# Patient Record
Sex: Female | Born: 1935 | Race: White | Hispanic: No | Smoking: Never smoker
Health system: Southern US, Community
[De-identification: ages and names within clinical notes are randomized; demographics above are authoritative.]

## PROBLEM LIST (undated history)

## (undated) DIAGNOSIS — I10 Essential (primary) hypertension: Secondary | ICD-10-CM

## (undated) DIAGNOSIS — M1990 Unspecified osteoarthritis, unspecified site: Secondary | ICD-10-CM

## (undated) DIAGNOSIS — E119 Type 2 diabetes mellitus without complications: Secondary | ICD-10-CM

---

## 2005-05-11 ENCOUNTER — Emergency Department (HOSPITAL_COMMUNITY): Admission: EM | Admit: 2005-05-11 | Discharge: 2005-05-12 | Payer: Self-pay | Admitting: Emergency Medicine

## 2008-07-22 ENCOUNTER — Ambulatory Visit (HOSPITAL_COMMUNITY): Admission: RE | Admit: 2008-07-22 | Discharge: 2008-07-22 | Payer: Self-pay | Admitting: Ophthalmology

## 2010-09-27 LAB — PROTIME-INR
INR: 1 (ref 0.00–1.49)
Prothrombin Time: 13 seconds (ref 11.6–15.2)

## 2010-09-27 LAB — URINALYSIS, ROUTINE W REFLEX MICROSCOPIC
Bilirubin Urine: NEGATIVE
Glucose, UA: NEGATIVE mg/dL
Hgb urine dipstick: NEGATIVE
Ketones, ur: NEGATIVE mg/dL
Leukocytes, UA: NEGATIVE
Nitrite: NEGATIVE
Protein, ur: 30 mg/dL — AB
Specific Gravity, Urine: 1.021 (ref 1.005–1.030)
Urobilinogen, UA: 0.2 mg/dL (ref 0.0–1.0)
pH: 5.5 (ref 5.0–8.0)

## 2010-09-27 LAB — URINE MICROSCOPIC-ADD ON

## 2010-09-27 LAB — COMPREHENSIVE METABOLIC PANEL
ALT: 25 U/L (ref 0–35)
AST: 28 U/L (ref 0–37)
Albumin: 3.9 g/dL (ref 3.5–5.2)
Alkaline Phosphatase: 75 U/L (ref 39–117)
BUN: 29 mg/dL — ABNORMAL HIGH (ref 6–23)
CO2: 23 mEq/L (ref 19–32)
Calcium: 9.8 mg/dL (ref 8.4–10.5)
Chloride: 109 mEq/L (ref 96–112)
Creatinine, Ser: 1.28 mg/dL — ABNORMAL HIGH (ref 0.4–1.2)
GFR calc Af Amer: 50 mL/min — ABNORMAL LOW (ref >60)
GFR calc non Af Amer: 41 mL/min — ABNORMAL LOW (ref >60)
Glucose, Bld: 70 mg/dL (ref 70–99)
Potassium: 4.1 mEq/L (ref 3.5–5.1)
Sodium: 141 mEq/L (ref 135–145)
Total Bilirubin: 0.9 mg/dL (ref 0.3–1.2)
Total Protein: 6.9 g/dL (ref 6.0–8.3)

## 2010-09-27 LAB — GLUCOSE, CAPILLARY
Glucose-Capillary: 138 mg/dL — ABNORMAL HIGH (ref 70–99)
Glucose-Capillary: 80 mg/dL (ref 70–99)
Glucose-Capillary: 88 mg/dL (ref 70–99)

## 2010-09-27 LAB — CBC
HCT: 36.7 % (ref 36.0–46.0)
Hemoglobin: 12.5 g/dL (ref 12.0–15.0)
MCHC: 34 g/dL (ref 30.0–36.0)
MCV: 88.7 fL (ref 78.0–100.0)
Platelets: 313 10*3/uL (ref 150–400)
RBC: 4.14 MIL/uL (ref 3.87–5.11)
RDW: 15.1 % (ref 11.5–15.5)
WBC: 7.8 10*3/uL (ref 4.0–10.5)

## 2010-09-27 LAB — APTT: aPTT: 26 seconds (ref 24–37)

## 2010-10-25 NOTE — Op Note (Signed)
NAME:  Helen Gutierrez, Helen Gutierrez                 ACCOUNT NO.:  [id number]   MEDICAL RECORD NO.:  [id number]          PATIENT TYPE:  AMB   LOCATION:  SDS                          FACILITY:  [facility]   PHYSICIAN:  Lanna Poche, M.D. DATE OF BIRTH:  10/06/1933   DATE OF PROCEDURE:  07/22/2008  DATE OF DISCHARGE:                               OPERATIVE REPORT   PREOPERATIVE DIAGNOSIS:  Proliferative diabetic retinopathy with  nonclearing vitreous hemorrhage, left eye.   POSTOPERATIVE DIAGNOSIS:  Proliferative diabetic retinopathy with  nonclearing vitreous hemorrhage, left eye.   PROCEDURES:  Pars plana vitrectomy, peeling of posterior hyaloid face,  peeling of internal limiting membrane, peripheral panretinal  photocoagulation, subtenon Kenalog injection, left eye.   SURGEON:  Lanna Poche, MD   ANESTHESIA:  General endotracheal.   ESTIMATED BLOOD LOSS:  Less than 1 mL.   COMPLICATIONS:  None.   OPERATIVE NOTE:  The patient was taken to the operating room.  After  induction of general anesthesia, the left eye was prepped and draped in  the usual fashion.  The lid speculum was introduced in the conjunctival  peritomy was developed temporally and supranasally.  Hemostasis was  obtained with laser cautery, and sclerotomies were fashioned 3 mm  posterior to the limbus at 1:30, 10:30, and 4:30.  The superior  sclerotomies were plugged and a 4-mm infusion cannula was secured to the  4:30 sclerotomy with a temporary sutures of 7-0 Vicryl.  The tip was  visually inspected and found to be in good position.  Landers ring was  secured with globe 7-0 Vicryl sutures at 3 and 9.  The plugs were  removed and 30-degree prismatic lens was applied on the surface of the  eye.  A central core followed by peripheral vitrectomy was performed.  There was no posterior hyaloid separation.  The posterior hyaloid was  engaged with a vitrector and stripped off the surface of the retina.  The peripheral vitrectomy  was then completed at 360 degrees.  Scleral  depression was used to further trim the hemorrhagic to __________ blood  along the vitreous space with the inferior half of the retina.  Magnifying flat lens was inserted __________ the surface of the eye, a  small amount of hemorrhage that settled posteriorly was removed with a  silicone cap.  Inspection of the macula showed there to be __________  around the previous cystic changes of the glistening surface of the  retina, was elected to peel internal limiting membrane.  The MVR blade  was used to make an incision into the internal limiting membrane.  The  internal limited membrane was then elevated with a Rice pick and peeled  off the foveal tissue in 3 separate pieces.  The instrument removed from  the eyes and holes plugged.  The Monett lens were removed.  Inspection  with an ophthalmoscope and scleral depression revealed there to be no  retinal breaks or tears, and the panretinal photocoagulation filled in  the few gaps with an indirect ophthalmoscope laser.  The superior  sclerotomy was then closed with 7-0 Vicryl.  The  infusion cannula was  removed, preplaced and secured.  The subtenon space was injected with 4  mg of Kenalog inferotemporally.  The conjunctivae was drawn and  reapproximated with interrupted running suture of 6-0 plain gut.  The  pressure was checked with the Barraquer tonometer and found to lay at  21.  The subconjunctival space was irrigated with 0.75% Marcaine  followed by  subconjunctival injection of 100 mg of ceftazidime and 10 mg of  Decadron.  The lid speculum was then removed, mixed antibiotic ointment  was applied to the surface of the eye.  Eye patch and shield was then  placed over the patient's left eye at the completion of the surgery.           ______________________________  Lanna Poche, M.D.     JTH/MEDQ  D:  07/22/2008  T:  07/23/2008  Job:  [id number]

## 2010-10-25 NOTE — Op Note (Signed)
NAME:  Helen Gutierrez, Helen Gutierrez                 ACCOUNT NO.:  [id number]   MEDICAL RECORD NO.:  [id number]          PATIENT TYPE:  AMB   LOCATION:  SDS                          FACILITY:  [facility]   PHYSICIAN:  Lanna Poche, M.D. DATE OF BIRTH:  1933-06-24   DATE OF PROCEDURE:  07/22/2008  DATE OF DISCHARGE:                               OPERATIVE REPORT   PREOPERATIVE DIAGNOSIS:  Proliferative diabetic retinopathy with  nonclearing vitreous hemorrhage, left eye.   POSTOPERATIVE DIAGNOSIS:  Proliferative diabetic retinopathy with  nonclearing vitreous hemorrhage, left eye.   PROCEDURE:  Partial vitrectomy, peeling of internal limiting membrane,  peeling of posterior hyaloid face, peripheral panretinal  photocoagulation, subtenon Kenalog injection, left eye.   SURGEON:  Lanna Poche, MD   ASSISTANT:   Dictation ended at this point.           ______________________________  Lanna Poche, M.D.     JTH/MEDQ  D:  07/22/2008  T:  07/23/2008  Job:  [id number]

## 2018-03-05 ENCOUNTER — Emergency Department (INDEPENDENT_AMBULATORY_CARE_PROVIDER_SITE_OTHER)
Admission: EM | Admit: 2018-03-05 | Discharge: 2018-03-05 | Disposition: A | Discharge status: Home / Self Care | Payer: Medicare Other | Source: Home / Self Care | Attending: Family Medicine | Admitting: Family Medicine

## 2018-03-05 ENCOUNTER — Other Ambulatory Visit: Payer: Self-pay

## 2018-03-05 ENCOUNTER — Emergency Department (INDEPENDENT_AMBULATORY_CARE_PROVIDER_SITE_OTHER): Payer: Medicare Other

## 2018-03-05 DIAGNOSIS — S40021A Contusion of right upper arm, initial encounter: Secondary | ICD-10-CM

## 2018-03-05 DIAGNOSIS — I739 Peripheral vascular disease, unspecified: Secondary | ICD-10-CM

## 2018-03-05 DIAGNOSIS — S51811A Laceration without foreign body of right forearm, initial encounter: Secondary | ICD-10-CM

## 2018-03-05 DIAGNOSIS — Z23 Encounter for immunization: Secondary | ICD-10-CM

## 2018-03-05 HISTORY — DX: Unspecified osteoarthritis, unspecified site: M19.90

## 2018-03-05 HISTORY — DX: Type 2 diabetes mellitus without complications: E11.9

## 2018-03-05 HISTORY — DX: Essential (primary) hypertension: I10

## 2018-03-05 MED ORDER — TETANUS-DIPHTH-ACELL PERTUSSIS 5-2.5-18.5 LF-MCG/0.5 IM SUSP
0.5000 mL | Freq: Once | INTRAMUSCULAR | Status: AC
  Administered 2018-03-05: 0.5 mL via INTRAMUSCULAR

## 2018-03-05 NOTE — ED Triage Notes (Signed)
Pt stated that she fell against the wall/ her suitcase yesterday morning.  Elbow area swollen painful to touch, and skin tear just below elbow.

## 2018-03-05 NOTE — ED Provider Notes (Signed)
[facility]    CSN: [id number] Arrival date & time: 03/05/18  1246     History   Chief Complaint Chief Complaint  Patient presents with  . Arm Pain    HPI Helen Gutierrez is a 84 y.o. female.   Patient lost her balance in her bedroom yesterday and scraped her right lateral arm against a suitcase, resulting in a skin tear/abrasion.  She does not remember her last Tdap.   Arm Injury  Location:  Elbow Elbow location:  R elbow Injury: yes   Time since incident:  1 day Mechanism of injury comment:  Abrasion Pain details:    Quality:  Aching   Radiates to:  Does not radiate   Severity:  Mild   Onset quality:  Sudden   Duration:  1 day   Timing:  Constant   Progression:  Improving Dislocation: no   Tetanus status:  Out of date Prior injury to area:  No Relieved by:  None tried Ineffective treatments: bandage. Associated symptoms: swelling   Associated symptoms: no decreased range of motion, no numbness, no stiffness and no tingling     Past Medical History:  Diagnosis Date  . Arthritis   . Diabetes mellitus without complication ([place])   . Hypertension     There are no active problems to display for this patient.   History reviewed. No pertinent surgical history.  OB History   None      Home Medications    Prior to Admission medications   Medication Sig Start Date End Date Taking? Authorizing Provider  amLODipine (NORVASC) 5 MG tablet Take by mouth. 01/01/13  Yes [provider]  clopidogrel (PLAVIX) 75 MG tablet Take by mouth. 01/01/13  Yes [provider]  fluticasone (FLONASE) 50 MCG/ACT nasal spray Place into the nose. 02/09/17  Yes [provider]  furosemide (LASIX) 20 MG tablet Take by mouth. 12/08/16  Yes [provider]  glucose blood (ACCU-CHEK GUIDE) test strip USE 1 STRIP THREE TIMES DAILY 09/10/17  Yes [provider]  hydroxychloroquine (PLAQUENIL) 200 MG tablet Take by mouth. 12/12/17  Yes  [provider]  Insulin Lispro Prot & Lispro (HUMALOG MIX 75/25 KWIKPEN) (75-25) 100 UNIT/ML Kwikpen Inject into the skin. 02/01/13  Yes [provider]  isosorbide mononitrate (IMDUR) 60 MG 24 hr tablet TAKE ONE TABLET BY MOUTH EVERY DAY 01/14/13  Yes [provider]  lidocaine (LIDODERM) 5 % Apply patch to painful area. Patch may remain in place for up to 12 hours in a 24 hour period. 02/13/18 03/15/18 Yes [provider]  lisinopril (PRINIVIL,ZESTRIL) 20 MG tablet Take by mouth. 03/30/14  Yes [provider]  nitroGLYCERIN (NITROSTAT) 0.4 MG SL tablet Place under the tongue. 09/26/12  Yes [provider]  pantoprazole (PROTONIX) 40 MG tablet Take by mouth. 03/30/14  Yes [provider]  traMADol (ULTRAM) 50 MG tablet Take by mouth. 02/12/18  Yes [provider]  acetaminophen (TYLENOL) 500 MG tablet Take by mouth.    [provider]  Blood Glucose Monitoring Suppl (ACCU-CHEK GUIDE) w/Device KIT USE AS DIRECTED BY PHYSICIAN TO MONITOR BLOOD SUGARS. 02/26/18   [provider]  cetirizine (ZYRTEC) 10 MG tablet Take by mouth.    [provider]  ondansetron (ZOFRAN-ODT) 4 MG disintegrating tablet  02/14/18   [provider]    Family History History reviewed. No pertinent family history.  Social History Social History   Tobacco Use  . Smoking status: Unknown If  Ever Smoked  . Smokeless tobacco: Never Used  Substance Use Topics  . Alcohol use: Not Currently  . Drug use: Not Currently     Allergies   Sulfa antibiotics and Sulfasalazine   Review of Systems Review of Systems  Musculoskeletal: Negative for stiffness.  All other systems reviewed and are negative.    Physical Exam Triage Vital Signs ED Triage Vitals  Enc Vitals Group     BP 03/05/18 1317 138/71     Pulse Rate 03/05/18 1317 98     Resp --      Temp 03/05/18 1317 (!) 97.4 F (36.3 C)     Temp Source 03/05/18 1317  Oral     SpO2 03/05/18 1317 98 %     Weight 03/05/18 1318 134 lb (60.8 kg)     Height --      Head Circumference --      Peak Flow --      Pain Score --      Pain Loc --      Pain Edu? --      Excl. in [place]? --    No data found.  Updated Vital Signs BP 138/71 (BP Location: Right Arm)   Pulse 98   Temp (!) 97.4 F (36.3 C) (Oral)   Wt 60.8 kg   SpO2 98%   Visual Acuity Right Eye Distance:   Left Eye Distance:   Bilateral Distance:    Right Eye Near:   Left Eye Near:    Bilateral Near:     Physical Exam  Constitutional: She appears well-developed and well-nourished. No distress.  HENT:  Head: Atraumatic.  Eyes: Pupils are equal, round, and reactive to light. Conjunctivae are normal.  Neck: Normal range of motion.  Cardiovascular: Normal rate.  Pulmonary/Chest: Effort normal.  Musculoskeletal:       Right elbow: She exhibits swelling. Tenderness found. Radial head tenderness noted.       Arms: Right elbow has full range of motion, with tenderness to palpation over the radial head.  There is a 4cm diameter skin tear present as noted on diagram.  Avulsed skin at edge of wound does not appear viable.  No surrounding erythema or warmth.  Right forearm has mild generalized edema with mild tenderness to palpation.  Distal neurovascular function is intact.   Neurological: She is alert.  Skin: Skin is warm and dry.  Nursing note and vitals reviewed.    UC Treatments / Results  Labs (all labs ordered are listed, but only abnormal results are displayed) Labs Reviewed - No data to display  EKG None  Radiology Dg Elbow Complete Right  Result Date: 03/05/2018 CLINICAL DATA:  Patient slipped and fell. EXAM: RIGHT ELBOW - COMPLETE 3+ VIEW COMPARISON:  No recent prior. FINDINGS: No acute bony or joint abnormality identified. No evidence of fracture or dislocation. Degenerative changes right elbow. Peripheral vascular calcification. IMPRESSION: 1.  No acute abnormality.   Degenerative changes right elbow. 2.  Peripheral vascular disease. Electronically Signed   By: Marcello Moores  Register   On: 03/05/2018 14:00    Procedures Procedures  Wound debridement With sterile technique, lavaged skin tear/abrasion right arm with sterile saline.  Debrided non-viable skin from edge of wound.  Applied Bacitracin followed by 10cm by 10cm Mepelex Border dressing.  Secured Mepelex with light wrap of CoBan  Medications Ordered in UC Medications  Tdap (BOOSTRIX) injection 0.5 mL (0.5 mLs Intramuscular Given 03/05/18 1355)    Initial Impression / Assessment and Plan /  UC Course  I have reviewed the triage vital signs and the nursing notes.  Pertinent labs & imaging results that were available during my care of the patient were reviewed by me and considered in my medical decision making (see chart for details).    Administered Tdap  Return in 3 days for followup visit and dressing change (will redress with Mepelex Border).    Return for any signs of infection:  Increasing redness, swelling, pain, heat, drainage, etc.      Final Clinical Impressions(s) / UC Diagnoses   Final diagnoses:  Contusion of right upper extremity, initial encounter  Skin tear of right forearm without complication, initial encounter     Discharge Instructions     Elevate arm whenever possible.  Keep bandage clean and dry, and leave in place until follow-up visit.  May take Tylenol as needed for pain.    ED Prescriptions    None         Kandra Nicolas, MD 03/05/18 [id number]

## 2018-03-05 NOTE — Discharge Instructions (Signed)
Elevate arm whenever possible.  Keep bandage clean and dry, and leave in place until follow-up visit.  May take Tylenol as needed for pain.

## 2018-03-08 ENCOUNTER — Emergency Department (INDEPENDENT_AMBULATORY_CARE_PROVIDER_SITE_OTHER)
Admission: EM | Admit: 2018-03-08 | Discharge: 2018-03-08 | Disposition: A | Discharge status: Home / Self Care | Payer: Medicare Other | Source: Home / Self Care | Attending: Family Medicine | Admitting: Family Medicine

## 2018-03-08 ENCOUNTER — Encounter: Payer: Self-pay | Admitting: *Deleted

## 2018-03-08 DIAGNOSIS — Z5189 Encounter for other specified aftercare: Secondary | ICD-10-CM

## 2018-03-08 NOTE — ED Triage Notes (Signed)
Patient is here for wound check to RFA

## 2018-03-08 NOTE — Discharge Instructions (Addendum)
Keep wound bandaged, clean, and dry. Return earlier if increased pain, swelling, redness occur.

## 2018-03-08 NOTE — ED Provider Notes (Signed)
[name]    CSN: [id number] Arrival date & time: 03/08/18  3005     History   Chief Complaint Chief Complaint  Patient presents with  . Wound Check    HPI Helen Gutierrez is a 84 y.o. female.   Patient returns for wound check and dressing change skin tear right forearm.  She reports mild burning at the site but no swelling or pain.  The history is provided by the patient and a relative.    Past Medical History:  Diagnosis Date  . Arthritis   . Diabetes mellitus without complication ([place])   . Hypertension     There are no active problems to display for this patient.   History reviewed. No pertinent surgical history.  OB History   None      Home Medications    Prior to Admission medications   Medication Sig Start Date End Date Taking? Authorizing Provider  acetaminophen (TYLENOL) 500 MG tablet Take by mouth.    [provider]  amLODipine (NORVASC) 5 MG tablet Take by mouth. 01/01/13   [provider]  Blood Glucose Monitoring Suppl (ACCU-CHEK GUIDE) w/Device KIT USE AS DIRECTED BY PHYSICIAN TO MONITOR BLOOD SUGARS. 02/26/18   [provider]  cetirizine (ZYRTEC) 10 MG tablet Take by mouth.    [provider]  clopidogrel (PLAVIX) 75 MG tablet Take by mouth. 01/01/13   [provider]  fluticasone (FLONASE) 50 MCG/ACT nasal spray Place into the nose. 02/09/17   [provider]  furosemide (LASIX) 20 MG tablet Take by mouth. 12/08/16   [provider]  glucose blood (ACCU-CHEK GUIDE) test strip USE 1 [place] DAILY 09/10/17   [provider]  hydroxychloroquine (PLAQUENIL) 200 MG tablet Take by mouth. 12/12/17   [provider]  Insulin Lispro Prot & Lispro (HUMALOG MIX 75/25 KWIKPEN) (75-25) 100 UNIT/ML Kwikpen Inject into the skin. 02/01/13   [provider]  isosorbide mononitrate (IMDUR) 60 MG 24 hr tablet TAKE ONE TABLET BY MOUTH EVERY DAY 01/14/13   [provider]  lidocaine (LIDODERM) 5 % Apply patch to painful area. Patch may remain in place for up to 12 hours in a 24 hour period. 02/13/18 03/15/18  [provider]  lisinopril (PRINIVIL,ZESTRIL) 20 MG tablet Take by mouth. 03/30/14   [provider]  nitroGLYCERIN (NITROSTAT) 0.4 MG SL tablet Place under the tongue. 09/26/12   [provider]  ondansetron (ZOFRAN-ODT) 4 MG disintegrating tablet  02/14/18   [provider]  pantoprazole (PROTONIX) 40 MG tablet Take by mouth. 03/30/14   [provider]  traMADol (ULTRAM) 50 MG tablet Take by mouth. 02/12/18   [provider]    Family History History reviewed. No pertinent family history.  Social History Social History   Tobacco Use  . Smoking status: Unknown If Ever Smoked  . Smokeless tobacco: Never Used  Substance Use Topics  . Alcohol use: Not Currently  . Drug use: Not Currently     Allergies   Sulfa antibiotics and Sulfasalazine   Review of Systems Review of Systems  Constitutional: Negative for chills, diaphoresis, fatigue and fever.  Musculoskeletal: Negative for joint swelling.  Skin: Negative for color change.  All other systems reviewed and are negative.    Physical Exam Triage Vital Signs ED Triage Vitals [03/08/18 1046]  Enc Vitals Group     BP (!) 153/73     Pulse Rate 86     Resp  Temp (!) 97.5 F (36.4 C)     Temp Source Oral     SpO2 99 %     Weight      Height      Head Circumference      Peak Flow      Pain Score 2     Pain Loc      Pain Edu?      Excl. in [place]?    No data found.  Updated Vital Signs BP (!) 153/73 (BP Location: Left Arm)   Pulse 86   Temp (!) 97.5 F (36.4 C) (Oral)   SpO2 99%   Visual Acuity Right Eye Distance:   Left Eye Distance:   Bilateral Distance:    Right Eye Near:   Left Eye Near:    Bilateral Near:     Physical Exam Patient appears comfortable and in no distress.  Removed bandage from abrasion  right arm.  Good granulation tissue developing. Small amount bleeding centrally; no evidence cellulitis.  Reapplied Bacitracin and Mepelex Border 10cm dressing followed by light wrap of CoBan.  UC Treatments / Results  Labs (all labs ordered are listed, but only abnormal results are displayed) Labs Reviewed - No data to display  EKG None  Radiology No results found.  Procedures Procedures (including critical care time)  Medications Ordered in UC Medications - No data to display  Initial Impression / Assessment and Plan / UC Course  I have reviewed the triage vital signs and the nursing notes.  Pertinent labs & imaging results that were available during my care of the patient were reviewed by me and considered in my medical decision making (see chart for details).    There is no evidence of bacterial infection today.  Return in five days for dressing change with Mepelex Border 10cm dressing  Final Clinical Impressions(s) / UC Diagnoses   Final diagnoses:  Visit for wound check     Discharge Instructions     Keep wound bandaged, clean, and dry. Return earlier if increased pain, swelling, redness occur.    ED Prescriptions    None         Kandra Nicolas, MD 03/08/18 1129

## 2018-03-14 ENCOUNTER — Emergency Department (INDEPENDENT_AMBULATORY_CARE_PROVIDER_SITE_OTHER)
Admission: EM | Admit: 2018-03-14 | Discharge: 2018-03-14 | Disposition: A | Discharge status: Home / Self Care | Payer: Medicare Other | Source: Home / Self Care | Attending: Family Medicine | Admitting: Family Medicine

## 2018-03-14 ENCOUNTER — Other Ambulatory Visit: Payer: Self-pay

## 2018-03-14 DIAGNOSIS — Z5189 Encounter for other specified aftercare: Secondary | ICD-10-CM

## 2018-03-14 MED ORDER — MUPIROCIN 2 % EX OINT: 1.0000 "application " | TOPICAL_OINTMENT | Freq: Two times a day (BID) | CUTANEOUS | 2 refills | Status: AC

## 2018-03-14 NOTE — Discharge Instructions (Signed)
Change dressing and apply mupirocin ointment twice daily or every other day.  Keep bandage clean and dry.  Return for any signs of infection (or follow-up with family doctor):  Increasing redness, swelling, pain, heat, drainage, etc.

## 2018-03-14 NOTE — ED Provider Notes (Signed)
[name]    CSN: [id number] Arrival date & time: 03/14/18  1435     History   Chief Complaint Chief Complaint  Patient presents with  . Wound Check    RT forearm    HPI Helen Gutierrez is a 84 y.o. female.   Patient returns for dressing change on abrasion/skin tear right forearm.  She denies significant pain/swelling.  The history is provided by the patient.    Past Medical History:  Diagnosis Date  . Arthritis   . Diabetes mellitus without complication ([place])   . Hypertension     There are no active problems to display for this patient.   History reviewed. No pertinent surgical history.  OB History   None      Home Medications    Prior to Admission medications   Medication Sig Start Date End Date Taking? Authorizing Provider  acetaminophen (TYLENOL) 500 MG tablet Take by mouth.    [provider]  amLODipine (NORVASC) 5 MG tablet Take by mouth. 01/01/13   [provider]  Blood Glucose Monitoring Suppl (ACCU-CHEK GUIDE) w/Device KIT USE AS DIRECTED BY PHYSICIAN TO MONITOR BLOOD SUGARS. 02/26/18   [provider]  cetirizine (ZYRTEC) 10 MG tablet Take by mouth.    [provider]  clopidogrel (PLAVIX) 75 MG tablet Take by mouth. 01/01/13   [provider]  fluticasone (FLONASE) 50 MCG/ACT nasal spray Place into the nose. 02/09/17   [provider]  furosemide (LASIX) 20 MG tablet Take by mouth. 12/08/16   [provider]  glucose blood (ACCU-CHEK GUIDE) test strip USE 1 [place] DAILY 09/10/17   [provider]  hydroxychloroquine (PLAQUENIL) 200 MG tablet Take by mouth. 12/12/17   [provider]  Insulin Lispro Prot & Lispro (HUMALOG MIX 75/25 KWIKPEN) (75-25) 100 UNIT/ML Kwikpen Inject into the skin. 02/01/13   [provider]  isosorbide mononitrate (IMDUR) 60 MG 24 hr tablet TAKE ONE TABLET BY MOUTH EVERY DAY 01/14/13   [provider]  lidocaine  (LIDODERM) 5 % Apply patch to painful area. Patch may remain in place for up to 12 hours in a 24 hour period. 02/13/18 03/15/18  [provider]  lisinopril (PRINIVIL,ZESTRIL) 20 MG tablet Take by mouth. 03/30/14   [provider]  mupirocin ointment (BACTROBAN) 2 % Apply 1 application topically 2 (two) times daily. 03/14/18   Kandra Nicolas, MD  nitroGLYCERIN (NITROSTAT) 0.4 MG SL tablet Place under the tongue. 09/26/12   [provider]  ondansetron (ZOFRAN-ODT) 4 MG disintegrating tablet  02/14/18   [provider]  pantoprazole (PROTONIX) 40 MG tablet Take by mouth. 03/30/14   [provider]  traMADol (ULTRAM) 50 MG tablet Take by mouth. 02/12/18   [provider]    Family History History reviewed. No pertinent family history.  Social History Social History   Tobacco Use  . Smoking status: Unknown If Ever Smoked  . Smokeless tobacco: Never Used  Substance Use Topics  . Alcohol use: Not Currently  . Drug use: Not Currently     Allergies   Sulfa antibiotics and Sulfasalazine   Review of Systems Review of Systems  Patient reports mild discomfort in right arm.  Physical Exam Triage Vital Signs ED Triage Vitals [03/14/18 1456]  Enc Vitals Group     BP 133/74     Pulse Rate 100     Resp      Temp 97.6 F (36.4 C)  Temp Source Oral     SpO2 99 %     Weight 134 lb (60.8 kg)     Height      Head Circumference      Peak Flow      Pain Score 0     Pain Loc      Pain Edu?      Excl. in [place]?    No data found.  Updated Vital Signs BP 133/74 (BP Location: Left Arm)   Pulse 100   Temp 97.6 F (36.4 C) (Oral)   Wt 60.8 kg   SpO2 99%   Visual Acuity Right Eye Distance:   Left Eye Distance:   Bilateral Distance:    Right Eye Near:   Left Eye Near:    Bilateral Near:     Physical Exam                    Nursing notes and Vital Signs reviewed. Appearance:  Patient appears stated age, and in no acute  distress Right forearm:  Removed Mepelix dressing.  Excellent granulation tissue present.  No surrounding erythema or tenderness to palpation.  UC Treatments / Results  Labs (all labs ordered are listed, but only abnormal results are displayed) Labs Reviewed - No data to display  EKG None  Radiology No results found.  Procedures Procedures  Dressing change:  Cleaned wound right arm with saline, then applied Bacitracin and Telfa pad followed by light wrap of CoBan.  Medications Ordered in UC Medications - No data to display  Initial Impression / Assessment and Plan / UC Course  I have reviewed the triage vital signs and the nursing notes.  Pertinent labs & imaging results that were available during my care of the patient were reviewed by me and considered in my medical decision making (see chart for details).    May begin changing bandage at home using OTC Telfa pads and Coban wrap. Rx for mupirocin ointment   Final Clinical Impressions(s) / UC Diagnoses   Final diagnoses:  Visit for wound check     Discharge Instructions     Change dressing and apply mupirocin ointment twice daily or every other day.  Keep bandage clean and dry.  Return for any signs of infection (or follow-up with family doctor):  Increasing redness, swelling, pain, heat, drainage, etc.      ED Prescriptions    Medication Sig Dispense Auth. Provider   mupirocin ointment (BACTROBAN) 2 % Apply 1 application topically 2 (two) times daily. 22 g Kandra Nicolas, MD        Kandra Nicolas, MD 03/16/18 [phone]

## 2018-03-14 NOTE — ED Triage Notes (Signed)
Pt here today for wound check and dressing change. Mentions wound now has foul odor that didn't before.

## 2018-11-10 ENCOUNTER — Other Ambulatory Visit: Payer: Self-pay

## 2018-11-10 ENCOUNTER — Emergency Department (INDEPENDENT_AMBULATORY_CARE_PROVIDER_SITE_OTHER)
Admission: EM | Admit: 2018-11-10 | Discharge: 2018-11-10 | Disposition: A | Discharge status: Home / Self Care | Payer: Medicare Other | Source: Home / Self Care

## 2018-11-10 DIAGNOSIS — E119 Type 2 diabetes mellitus without complications: Secondary | ICD-10-CM

## 2018-11-10 DIAGNOSIS — R739 Hyperglycemia, unspecified: Secondary | ICD-10-CM

## 2018-11-10 DIAGNOSIS — H5789 Other specified disorders of eye and adnexa: Secondary | ICD-10-CM | POA: Diagnosis not present

## 2018-11-10 LAB — POCT FASTING CBG KUC MANUAL ENTRY: POCT Glucose (KUC): 449 mg/dL — AB (ref 70–99)

## 2018-11-10 MED ORDER — ERYTHROMYCIN 5 MG/GM OP OINT: TOPICAL_OINTMENT | Freq: Three times a day (TID) | OPHTHALMIC | 0 refills | Status: AC

## 2018-11-10 NOTE — ED Provider Notes (Signed)
[name]    CSN: [id number] Arrival date & time: 11/10/18  1323     History   Chief Complaint Chief Complaint  Patient presents with  . Eye Pain    RT    HPI Helen Gutierrez is a 85 y.o. female.   HPI Helen Gutierrez is a 85 y.o. female presenting to UC with her granddaughter with concern about a mass on her Right eye that has become painful the last few days. Pt states she had a smaller bump in her eye last year, and an eye specialist tried to "poke it" but it wound not drain so she was advised to let it be unless it caused problems.  Over the last 3 days it has started to itch and burn.  She has not tried any OTC medications.  Pt normally wears glasses to help see but she did not bring them today.  Denies any other symptoms- cough, congestion, fever, chills, n/v/d.  Pt also requesting her blood sugar be checked because her glucose monitor is not working at home. She plans to go to her PCP tomorrow to exchange for a new one.   Past Medical History:  Diagnosis Date  . Arthritis   . Diabetes mellitus without complication ([place])   . Hypertension     There are no active problems to display for this patient.   History reviewed. No pertinent surgical history.  OB History   No obstetric history on file.      Home Medications    Prior to Admission medications   Medication Sig Start Date End Date Taking? Authorizing Provider  acetaminophen (TYLENOL) 500 MG tablet Take by mouth.    [provider]  amLODipine (NORVASC) 5 MG tablet Take by mouth. 01/01/13   [provider]  Blood Glucose Monitoring Suppl (ACCU-CHEK GUIDE) w/Device KIT USE AS DIRECTED BY PHYSICIAN TO MONITOR BLOOD SUGARS. 02/26/18   [provider]  cetirizine (ZYRTEC) 10 MG tablet Take by mouth.    [provider]  clopidogrel (PLAVIX) 75 MG tablet Take by mouth. 01/01/13   [provider]  erythromycin ophthalmic ointment Place into the right eye 3 (three)  times daily for 5 days. Place a 1/2 inch ribbon of ointment into the lower eyelid. 11/10/18 11/15/18  Noe Gens, PA-C  fluticasone (FLONASE) 50 MCG/ACT nasal spray Place into the nose. 02/09/17   [provider]  furosemide (LASIX) 20 MG tablet Take by mouth. 12/08/16   [provider]  glucose blood (ACCU-CHEK GUIDE) test strip USE 1 [place] DAILY 09/10/17   [provider]  hydroxychloroquine (PLAQUENIL) 200 MG tablet Take by mouth. 12/12/17   [provider]  Insulin Lispro Prot & Lispro (HUMALOG MIX 75/25 KWIKPEN) (75-25) 100 UNIT/ML Kwikpen Inject into the skin. 02/01/13   [provider]  isosorbide mononitrate (IMDUR) 60 MG 24 hr tablet TAKE ONE TABLET BY MOUTH EVERY DAY 01/14/13   [provider]  lisinopril (PRINIVIL,ZESTRIL) 20 MG tablet Take by mouth. 03/30/14   [provider]  mupirocin ointment (BACTROBAN) 2 % Apply 1 application topically 2 (two) times daily. 03/14/18   Kandra Nicolas, MD  nitroGLYCERIN (NITROSTAT) 0.4 MG SL tablet Place under the tongue. 09/26/12   [provider]  ondansetron (ZOFRAN-ODT) 4 MG disintegrating tablet  02/14/18   [provider]  pantoprazole (PROTONIX) 40 MG tablet Take by mouth. 03/30/14   [provider]  traMADol (ULTRAM) 50 MG tablet Take by mouth. 02/12/18  [provider]    Family History History reviewed. No pertinent family history.  Social History Social History   Tobacco Use  . Smoking status: Unknown If Ever Smoked  . Smokeless tobacco: Never Used  Substance Use Topics  . Alcohol use: Not Currently  . Drug use: Not Currently     Allergies   Sulfa antibiotics and Sulfasalazine   Review of Systems Review of Systems  Constitutional: Negative for chills and fever.  HENT: Negative for congestion and rhinorrhea.   Eyes: Positive for pain, redness and itching. Negative for photophobia, discharge and visual disturbance.   Neurological: Negative for dizziness and headaches.     Physical Exam Triage Vital Signs ED Triage Vitals  Enc Vitals Group     BP 11/10/18 1346 (!) 160/65     Pulse Rate 11/10/18 1346 87     Resp 11/10/18 1346 18     Temp 11/10/18 1346 (!) 97.5 F (36.4 C)     Temp Source 11/10/18 1346 Oral     SpO2 11/10/18 1346 98 %     Weight 11/10/18 1347 132 lb (59.9 kg)     Height 11/10/18 1347 '4\' 9"'  (1.448 m)     Head Circumference --      Peak Flow --      Pain Score 11/10/18 1347 8     Pain Loc --      Pain Edu? --      Excl. in [place]? --    No data found.  Updated Vital Signs BP (!) 160/65 (BP Location: Right Arm)   Pulse 87   Temp (!) 97.5 F (36.4 C) (Oral)   Resp 18   Ht '4\' 9"'  (1.448 m)   Wt 132 lb (59.9 kg)   SpO2 98%   BMI 28.56 kg/m   Visual Acuity- vision test was attempted however, pt unable to complete because she did not bring her glasses with her.  Right Eye Distance:   Left Eye Distance:   Bilateral Distance:    Physical Exam Vitals signs and nursing note reviewed.  Constitutional:      Appearance: Normal appearance. She is well-developed.  HENT:     Head: Normocephalic and atraumatic.     Nose: Nose normal.     Mouth/Throat:     Mouth: Mucous membranes are moist.  Eyes:     Extraocular Movements: Extraocular movements intact.     Conjunctiva/sclera:     Right eye: Right conjunctiva is injected.     Pupils: Pupils are equal, round, and reactive to light.   Neck:     Musculoskeletal: Normal range of motion.  Cardiovascular:     Rate and Rhythm: Normal rate.  Pulmonary:     Effort: Pulmonary effort is normal.  Musculoskeletal: Normal range of motion.  Skin:    General: Skin is warm and dry.  Neurological:     Mental Status: She is alert and oriented to person, place, and time.  Psychiatric:        Behavior: Behavior normal.      UC Treatments / Results  Labs (all labs ordered are listed, but only abnormal results are displayed) Labs  Reviewed  POCT FASTING CBG [facility] MANUAL ENTRY - Abnormal; Notable for the following components:      Result Value   POCT Glucose ([facility]) 449 (*)    All other components within normal limits    EKG None  Radiology No results found.  Procedures Procedures (including critical care time)  Medications Ordered in UC Medications - No data to display  Initial Impression / Assessment and Plan / UC Course  I have reviewed the triage vital signs and the nursing notes.  Pertinent labs & imaging results that were available during my care of the patient were reviewed by me and considered in my medical decision making (see chart for details).     Exam concerning for mass on cornea. Will start pt on erythromycin ointment at this time but strongly encouraged she call her eye specialist tomorrow for urgent appointment for further evaluation and treatment.  CBG per request of pt due to having broken glucose monitor at home: 449 Discussed with pt and granddaughter, pt states she can take 10 extra units of insulin when she gets home.  She plans to go to her PCP tomorrow to get a new glucose monitor.  Per granddaughter, pt lives with granddaughter's uncle who helps care for her. She will not be alone tonight.  Final Clinical Impressions(s) / UC Diagnoses   Final diagnoses:  Mass of eye, right  Hyperglycemia     Discharge Instructions      Please call your eye doctor or use the resource guide to schedule an appointment with an eye specialist tomorrow or Tuesday for further evaluation and treatment.  If you are having trouble scheduling an appointment, please call our office and we may be able to schedule an appointment sooner for you.    ED Prescriptions    Medication Sig Dispense Auth. Provider   erythromycin ophthalmic ointment Place into the right eye 3 (three) times daily for 5 days. Place a 1/2 inch ribbon of ointment into the lower eyelid. 3.5 g Noe Gens, PA-C     Controlled  Substance Prescriptions  Controlled Substance Registry consulted? Not Applicable   Tyrell Antonio 11/10/18 1551

## 2018-11-10 NOTE — Discharge Instructions (Signed)
°  Please call your eye doctor or use the resource guide to schedule an appointment with an eye specialist tomorrow or Tuesday for further evaluation and treatment.  If you are having trouble scheduling an appointment, please call our office and we may be able to schedule an appointment sooner for you.

## 2018-11-10 NOTE — ED Notes (Signed)
Pt requested BS check due to monitor at home not working. Ok per Waylan Rocher, [facility]

## 2018-11-10 NOTE — ED Triage Notes (Signed)
Pt c/o RT eye pain x 3 days. Tried warm compress. Red and swollen. Attempted eye exam but pt did not bring glasses. Pain 8/10

## 2020-04-20 ENCOUNTER — Emergency Department (INDEPENDENT_AMBULATORY_CARE_PROVIDER_SITE_OTHER): Payer: Medicare Other

## 2020-04-20 ENCOUNTER — Emergency Department (INDEPENDENT_AMBULATORY_CARE_PROVIDER_SITE_OTHER)
Admission: EM | Admit: 2020-04-20 | Discharge: 2020-04-20 | Disposition: A | Discharge status: Home / Self Care | Payer: Medicare Other | Source: Home / Self Care | Attending: Family Medicine | Admitting: Family Medicine

## 2020-04-20 ENCOUNTER — Other Ambulatory Visit: Payer: Self-pay

## 2020-04-20 ENCOUNTER — Encounter: Payer: Self-pay | Admitting: Emergency Medicine

## 2020-04-20 DIAGNOSIS — M5030 Other cervical disc degeneration, unspecified cervical region: Secondary | ICD-10-CM | POA: Diagnosis not present

## 2020-04-20 DIAGNOSIS — J918 Pleural effusion in other conditions classified elsewhere: Secondary | ICD-10-CM | POA: Diagnosis not present

## 2020-04-20 DIAGNOSIS — S2232XA Fracture of one rib, left side, initial encounter for closed fracture: Secondary | ICD-10-CM | POA: Diagnosis not present

## 2020-04-20 DIAGNOSIS — W19XXXA Unspecified fall, initial encounter: Secondary | ICD-10-CM | POA: Diagnosis not present

## 2020-04-20 DIAGNOSIS — R0781 Pleurodynia: Secondary | ICD-10-CM

## 2020-04-20 NOTE — Discharge Instructions (Addendum)
Wear rib belt sparingly.  Apply ice pack for 20 to 30 minutes, 3 to 4 times daily  Continue until pain and swelling decrease.  May take Tylenol as needed for pain. Use incentive spirometer to improve lung function.  If symptoms become significantly worse during the night or over the weekend, proceed to the local emergency room.

## 2020-04-20 NOTE — ED Provider Notes (Signed)
[name]    CSN: [id number] Arrival date & time: 04/20/20  1135      History   Chief Complaint Chief Complaint  Patient presents with  . Fall    HPI Helen Gutierrez is a 86 y.o. female.   Patient tripped and fell 9 days ago.  She heard a popping sound and has had persistent pain in her left chest and mild soreness in her left neck.  She denies loss of consciousness and shortness of breath.  The history is provided by the patient and a relative.    Past Medical History:  Diagnosis Date  . Arthritis   . Diabetes mellitus without complication ([place])   . Hypertension     There are no problems to display for this patient.   No past surgical history on file.  OB History   No obstetric history on file.      Home Medications    Prior to Admission medications   Medication Sig Start Date End Date Taking? Authorizing Provider  acetaminophen (TYLENOL) 500 MG tablet Take by mouth.    [provider]  amLODipine (NORVASC) 5 MG tablet Take by mouth. 01/01/13   [provider]  Blood Glucose Monitoring Suppl (ACCU-CHEK GUIDE) w/Device KIT USE AS DIRECTED BY PHYSICIAN TO MONITOR BLOOD SUGARS. 02/26/18   [provider]  cetirizine (ZYRTEC) 10 MG tablet Take by mouth.    [provider]  clopidogrel (PLAVIX) 75 MG tablet Take by mouth. 01/01/13   [provider]  fluticasone (FLONASE) 50 MCG/ACT nasal spray Place into the nose. 02/09/17   [provider]  furosemide (LASIX) 20 MG tablet Take by mouth. 12/08/16   [provider]  glucose blood (ACCU-CHEK GUIDE) test strip USE 1 [place] DAILY 09/10/17   [provider]  hydroxychloroquine (PLAQUENIL) 200 MG tablet Take by mouth. 12/12/17   [provider]  Insulin Lispro Prot & Lispro (HUMALOG MIX 75/25 KWIKPEN) (75-25) 100 UNIT/ML Kwikpen Inject into the skin. 02/01/13   [provider]  isosorbide mononitrate (IMDUR) 60 MG 24 hr  tablet TAKE ONE TABLET BY MOUTH EVERY DAY 01/14/13   [provider]  lisinopril (PRINIVIL,ZESTRIL) 20 MG tablet Take by mouth. 03/30/14   [provider]  mupirocin ointment (BACTROBAN) 2 % Apply 1 application topically 2 (two) times daily. 03/14/18   Kandra Nicolas, MD  nitroGLYCERIN (NITROSTAT) 0.4 MG SL tablet Place under the tongue. 09/26/12   [provider]  ondansetron (ZOFRAN-ODT) 4 MG disintegrating tablet  02/14/18   [provider]  pantoprazole (PROTONIX) 40 MG tablet Take by mouth. 03/30/14   [provider]  traMADol (ULTRAM) 50 MG tablet Take by mouth. 02/12/18   [provider]    Family History Family History  Problem Relation Age of Onset  . Heart attack Mother   . Emphysema Father     Social History Social History   Tobacco Use  . Smoking status: Never Smoker  . Smokeless tobacco: Never Used  Vaping Use  . Vaping Use: Never used  Substance Use Topics  . Alcohol use: Not Currently  . Drug use: Not Currently     Allergies   Sulfa antibiotics and Sulfasalazine   Review of Systems Review of Systems  Constitutional: Negative for activity change, appetite change, chills, diaphoresis, fatigue and fever.  HENT: Negative.   Eyes: Negative.   Respiratory: Positive for chest tightness. Negative for cough, shortness of breath, wheezing and stridor.  Cardiovascular: Positive for chest pain. Negative for palpitations and leg swelling.  Gastrointestinal: Negative.   Genitourinary: Negative.   Musculoskeletal: Positive for neck pain.  Skin: Negative.   Neurological: Negative for dizziness, syncope, weakness, light-headedness and headaches.     Physical Exam Triage Vital Signs ED Triage Vitals  Enc Vitals Group     BP 04/20/20 1154 (!) 153/67     Pulse Rate 04/20/20 1154 65     Resp --      Temp 04/20/20 1154 97.7 F (36.5 C)     Temp Source 04/20/20 1154 Oral     SpO2 04/20/20 1154 94 %     Weight  04/20/20 1155 132 lb (59.9 kg)     Height 04/20/20 1155 _0  (1.448 m)     Head Circumference --      Peak Flow --      Pain Score 04/20/20 1154 3     Pain Loc --      Pain Edu? --      Excl. in [place]? --    No data found.  Updated Vital Signs BP (!) 153/67 (BP Location: Right Arm)   Pulse 65   Temp 97.7 F (36.5 C) (Oral)   Ht _1  (1.448 m)   Wt 59.9 kg   SpO2 94%   BMI 28.56 kg/m   Visual Acuity Right Eye Distance:   Left Eye Distance:   Bilateral Distance:    Right Eye Near:   Left Eye Near:    Bilateral Near:     Physical Exam Vitals and nursing note reviewed.  Constitutional:      General: She is not in acute distress. HENT:     Head: Normocephalic.     Right Ear: External ear normal.     Left Ear: External ear normal.     Nose: Nose normal.     Mouth/Throat:     Mouth: Mucous membranes are moist.     Pharynx: Oropharynx is clear.  Eyes:     Conjunctiva/sclera: Conjunctivae normal.     Pupils: Pupils are equal, round, and reactive to light.  Neck:      Comments: Tenderness over left trapezius muscle as noted on diagram.  Cardiovascular:     Rate and Rhythm: Normal rate and regular rhythm.     Heart sounds: Normal heart sounds.  Pulmonary:     Breath sounds: Normal breath sounds.    Chest:     Chest wall: Tenderness present. No swelling or crepitus.       Comments: Diffuse mild tenderness to palpation left chest as noted on diagram.  Abdominal:     Palpations: Abdomen is soft.     Tenderness: There is no abdominal tenderness.  Musculoskeletal:        General: No swelling or tenderness.     Cervical back: Normal range of motion and neck supple. Muscular tenderness present.  Lymphadenopathy:     Cervical: No cervical adenopathy.  Skin:    General: Skin is warm and dry.  Neurological:     General: No focal deficit present.     Mental Status: She is alert.      UC Treatments / Results  Labs (all labs ordered are listed, but only abnormal  results are displayed) Labs Reviewed - No data to display  EKG   Radiology DG Ribs Unilateral W/Chest Left  Result Date: 04/20/2020 CLINICAL DATA:  Left chest pain after fall 9 days ago. EXAM: LEFT RIBS AND CHEST -  3+ VIEW COMPARISON:  July 22, 2008. FINDINGS: Possible minimally displaced fracture is seen involving the anterior portion of the left sixth rib of indeterminate age. No pneumothorax is noted. Mild right basilar subsegmental atelectasis is noted with small right pleural effusion. Status post coronary bypass graft. Heart size and mediastinal contours are within normal limits. IMPRESSION: Possible minimally displaced left sixth rib fracture of indeterminate age. Mild right basilar subsegmental atelectasis with small right pleural effusion. No pneumothorax is noted. Electronically Signed   By: Marijo Conception M.D.   On: 04/20/2020 13:23   DG Cervical Spine Complete  Result Date: 04/20/2020 CLINICAL DATA:  Neck pain after fall 9 days ago. EXAM: CERVICAL SPINE - COMPLETE 4+ VIEW COMPARISON:  None. FINDINGS: Minimal grade 1 retrolisthesis of C3-4 is noted secondary to mild degenerative disc disease at this level. Mild degenerative disc disease is also noted at C4-5 and C5-6. Anterior osteophyte formation is noted at C5-6 and C6-7. No fracture is noted. No prevertebral soft tissue swelling is noted. No definite neural foraminal stenosis is noted. IMPRESSION: Mild multilevel degenerative disc disease. No acute abnormality seen in the cervical spine. Electronically Signed   By: Marijo Conception M.D.   On: 04/20/2020 13:25    Procedures Procedures (including critical care time)  Medications Ordered in UC Medications - No data to display  Initial Impression / Assessment and Plan / UC Course  I have reviewed the triage vital signs and the nursing notes.  Pertinent labs & imaging results that were available during my care of the patient were reviewed by me and considered in my medical  decision making (see chart for details).    Dispensed rib belt to use sparingly.  Recommend follow-up with PCP (note small right pleural effusion).   Final Clinical Impressions(s) / UC Diagnoses   Final diagnoses:  Fall, initial encounter  Closed fracture of one rib of left side, initial encounter     Discharge Instructions     Wear rib belt sparingly.  Apply ice pack for 20 to 30 minutes, 3 to 4 times daily  Continue until pain and swelling decrease.  May take Tylenol as needed for pain. Use incentive spirometer to improve lung function.  If symptoms become significantly worse during the night or over the weekend, proceed to the local emergency room.     ED Prescriptions    None        Kandra Nicolas, MD 04/24/20 1537

## 2020-04-20 NOTE — ED Triage Notes (Signed)
Fall Left flank pain from fall 9 days ago, heard a pop,  doesn't remember why she fell. Vaccinated.

## 2021-08-22 IMAGING — DX DG CERVICAL SPINE COMPLETE 4+V
7 series · 7 of 7 positions shown · non-contrast
Comparison: None.

CLINICAL DATA: Neck pain after fall 9 days ago.

EXAM:
CERVICAL SPINE - COMPLETE 4+ VIEW

[c-spine lat]
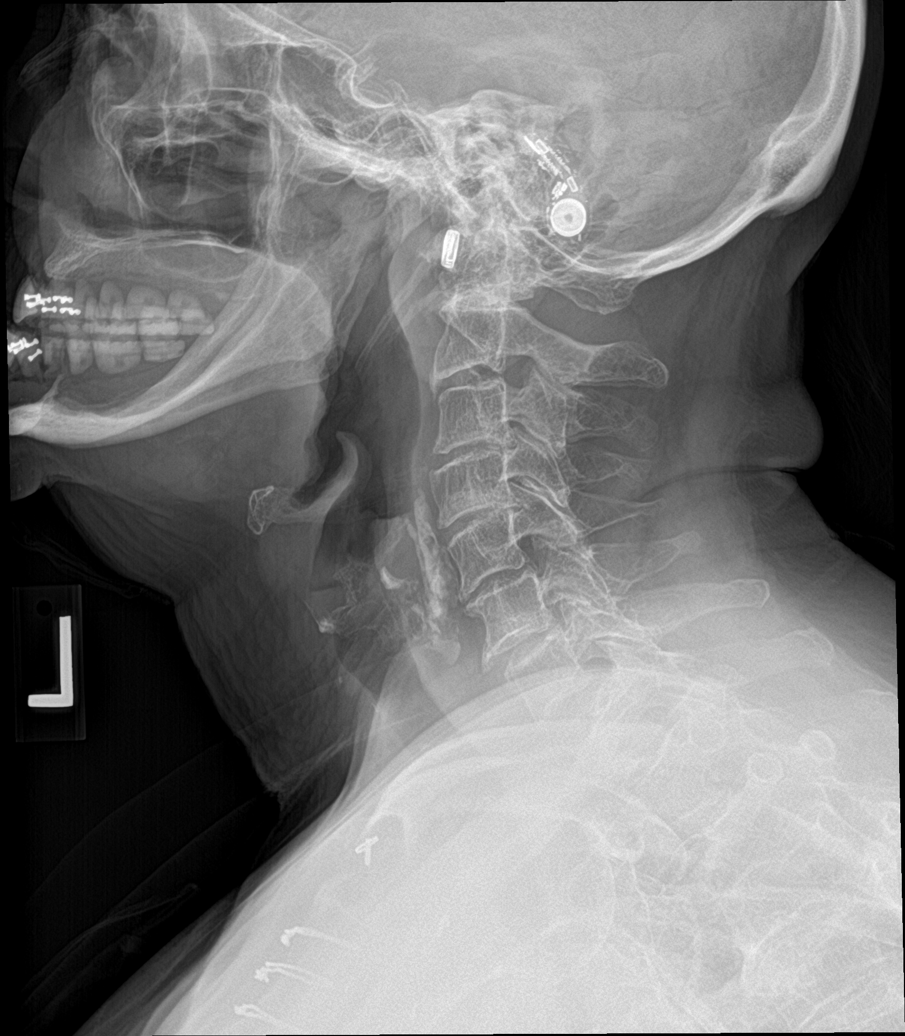

[c-spine obl (1 of 2)]
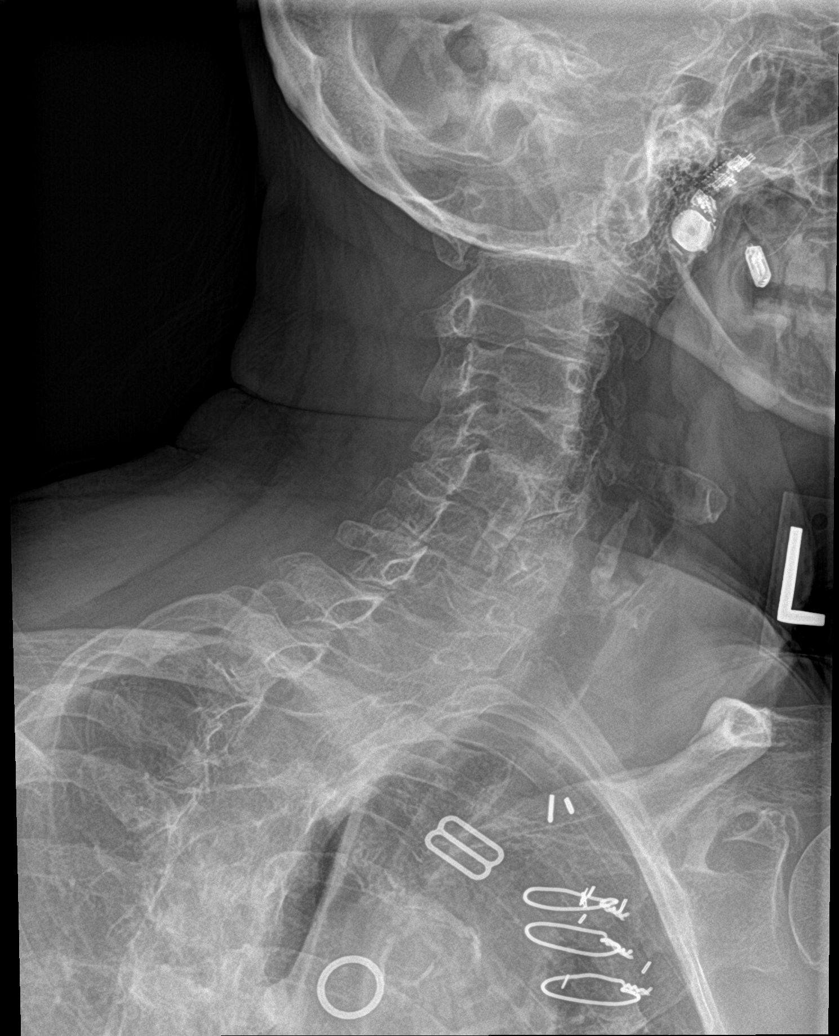

[c-spine obl (2 of 2)]
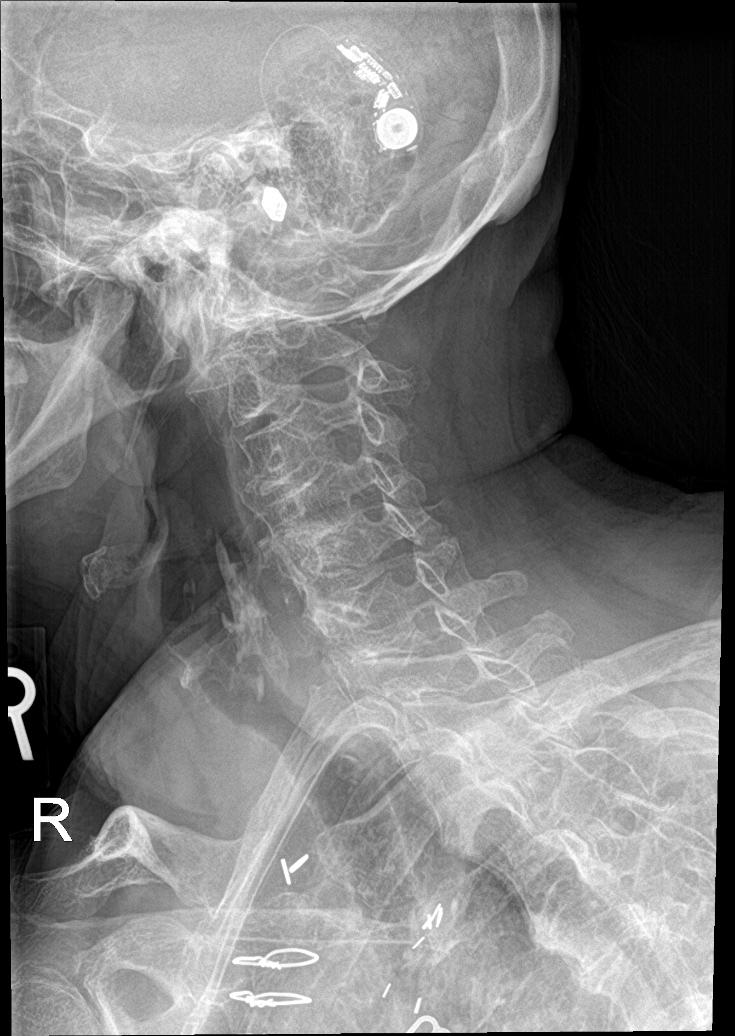

[c-spine ap]
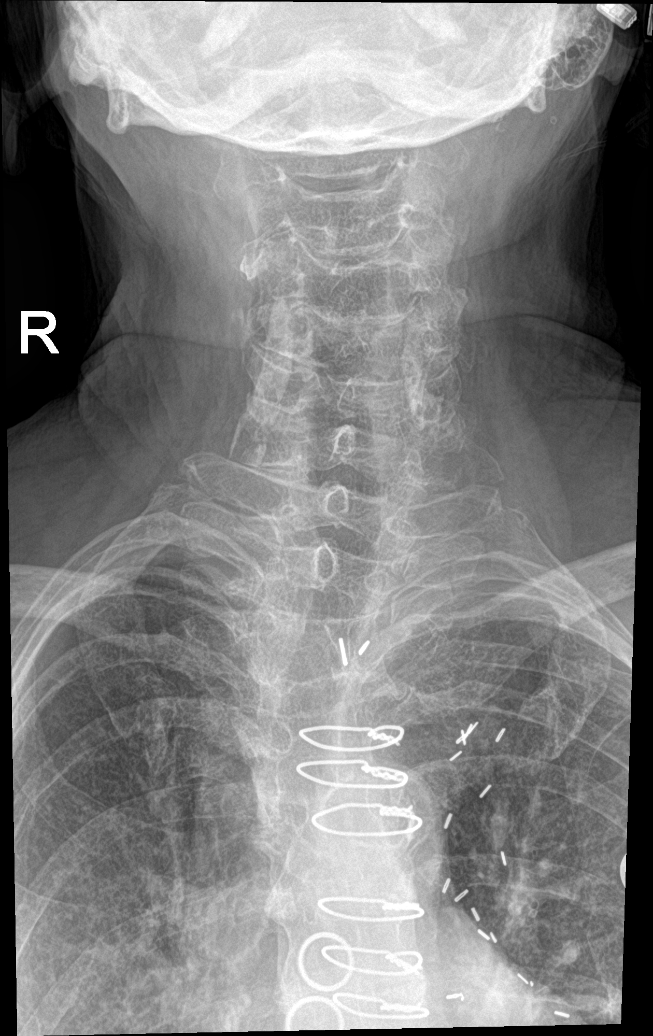

[c-spine open mouth]
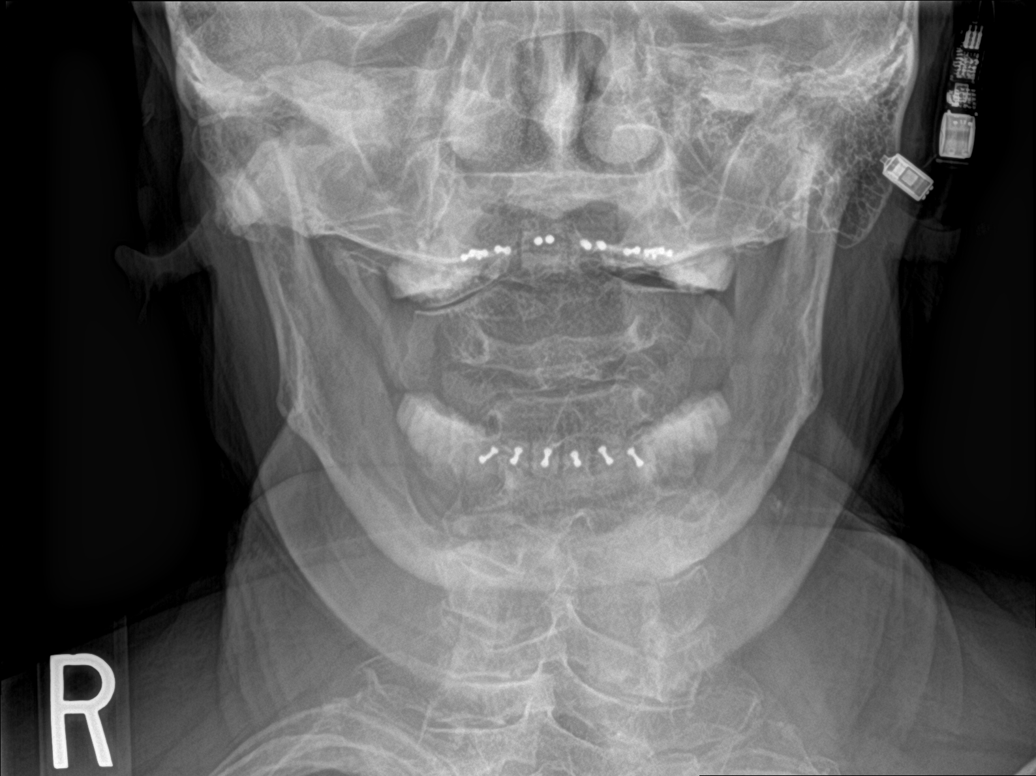

[c-spine swimmers]
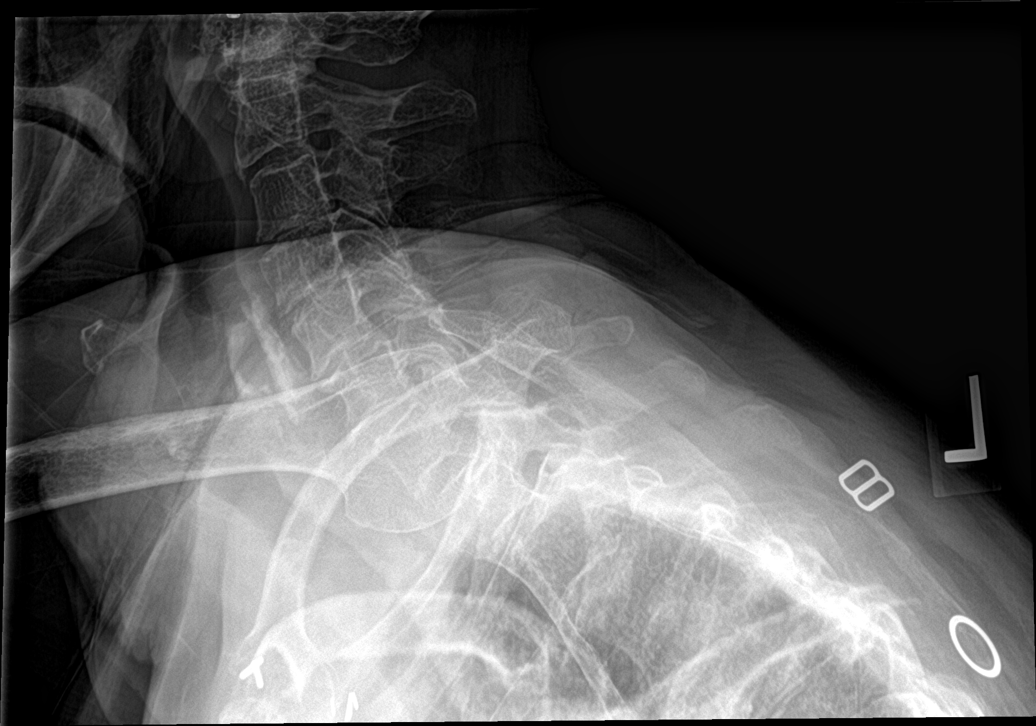

[[person_name]]
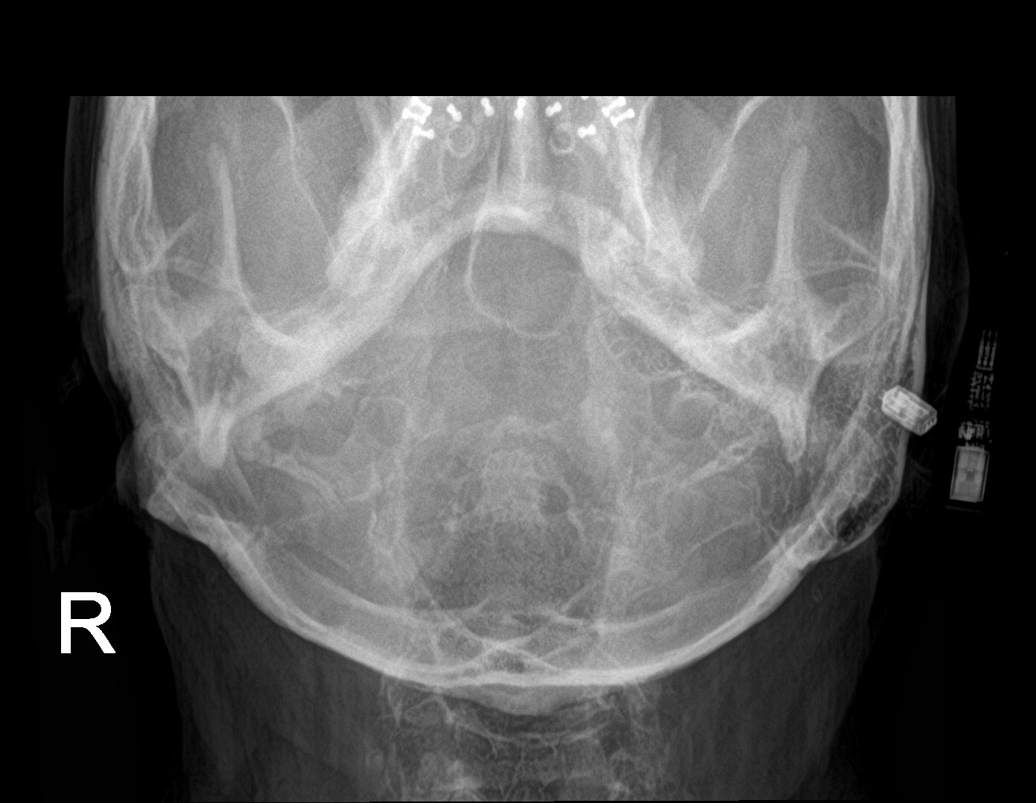

[7 of 7 positions shown; findings below may reference images not displayed]

FINDINGS: Minimal grade 1 retrolisthesis of C3-4 is noted secondary to mild
degenerative disc disease at this level. Mild degenerative disc
disease is also noted at C4-5 and C5-6. Anterior osteophyte
formation is noted at C5-6 and C6-7. No fracture is noted. No
prevertebral soft tissue swelling is noted. No definite neural
foraminal stenosis is noted.
IMPRESSION: Mild multilevel degenerative disc disease. No acute abnormality seen
in the cervical spine.

## 2022-04-12 DEATH — deceased
